# Patient Record
Sex: Male | Born: 1945 | Race: White | Hispanic: No | Marital: Married | Smoking: Never smoker
Health system: Southern US, Community
[De-identification: ages and names within clinical notes are randomized; demographics above are authoritative.]

## PROBLEM LIST (undated history)

## (undated) DIAGNOSIS — N289 Disorder of kidney and ureter, unspecified: Secondary | ICD-10-CM

## (undated) HISTORY — PX: JOINT REPLACEMENT: SHX530

---

## 2015-07-10 ENCOUNTER — Emergency Department (HOSPITAL_COMMUNITY): Payer: Medicare HMO

## 2015-07-10 ENCOUNTER — Encounter (HOSPITAL_COMMUNITY): Payer: Self-pay

## 2015-07-10 ENCOUNTER — Emergency Department (HOSPITAL_COMMUNITY)
Admission: EM | Admit: 2015-07-10 | Discharge: 2015-07-10 | Disposition: A | Discharge status: Home / Self Care | Payer: Medicare HMO | Attending: Emergency Medicine | Admitting: Emergency Medicine

## 2015-07-10 DIAGNOSIS — R109 Unspecified abdominal pain: Secondary | ICD-10-CM

## 2015-07-10 DIAGNOSIS — Z79899 Other long term (current) drug therapy: Secondary | ICD-10-CM | POA: Diagnosis not present

## 2015-07-10 DIAGNOSIS — N200 Calculus of kidney: Secondary | ICD-10-CM | POA: Insufficient documentation

## 2015-07-10 HISTORY — DX: Disorder of kidney and ureter, unspecified: N28.9

## 2015-07-10 LAB — URINE MICROSCOPIC-ADD ON

## 2015-07-10 LAB — URINALYSIS, ROUTINE W REFLEX MICROSCOPIC
BILIRUBIN URINE: NEGATIVE
Glucose, UA: NEGATIVE mg/dL
Ketones, ur: NEGATIVE mg/dL
NITRITE: NEGATIVE
PH: 5 (ref 5.0–8.0)
Protein, ur: 30 mg/dL — AB

## 2015-07-10 LAB — CBC WITH DIFFERENTIAL/PLATELET
Basophils Absolute: 0.1 10*3/uL (ref 0.0–0.1)
Basophils Relative: 1 %
Eosinophils Absolute: 0.1 10*3/uL (ref 0.0–0.7)
Eosinophils Relative: 1 %
HEMATOCRIT: 42.7 % (ref 39.0–52.0)
HEMOGLOBIN: 14.3 g/dL (ref 13.0–17.0)
LYMPHS ABS: 2.6 10*3/uL (ref 0.7–4.0)
Lymphocytes Relative: 37 %
MCH: 29.1 pg (ref 26.0–34.0)
MCHC: 33.5 g/dL (ref 30.0–36.0)
MCV: 86.8 fL (ref 78.0–100.0)
MONOS PCT: 9 %
Monocytes Absolute: 0.6 10*3/uL (ref 0.1–1.0)
NEUTROS ABS: 3.7 10*3/uL (ref 1.7–7.7)
NEUTROS PCT: 52 %
Platelets: 227 10*3/uL (ref 150–400)
RBC: 4.92 MIL/uL (ref 4.22–5.81)
RDW: 13.7 % (ref 11.5–15.5)
WBC: 7 10*3/uL (ref 4.0–10.5)

## 2015-07-10 LAB — BASIC METABOLIC PANEL
Anion gap: 8 (ref 5–15)
BUN: 13 mg/dL (ref 6–20)
CHLORIDE: 106 mmol/L (ref 101–111)
CO2: 23 mmol/L (ref 22–32)
CREATININE: 1.05 mg/dL (ref 0.61–1.24)
Calcium: 9.2 mg/dL (ref 8.9–10.3)
GFR calc non Af Amer: >60 mL/min (ref >60)
Glucose, Bld: 181 mg/dL — ABNORMAL HIGH (ref 65–99)
POTASSIUM: 3.6 mmol/L (ref 3.5–5.1)
Sodium: 137 mmol/L (ref 135–145)

## 2015-07-10 MED ORDER — OXYCODONE-ACETAMINOPHEN 5-325 MG PO TABS: 1.0000 | ORAL_TABLET | ORAL | Status: AC | PRN

## 2015-07-10 MED ORDER — ONDANSETRON HCL 4 MG/2ML IJ SOLN
4.0000 mg | Freq: Once | INTRAMUSCULAR | Status: AC
  Administered 2015-07-10: 4 mg via INTRAVENOUS
  Filled 2015-07-10: qty 2

## 2015-07-10 MED ORDER — KETOROLAC TROMETHAMINE 30 MG/ML IJ SOLN
30.0000 mg | Freq: Once | INTRAMUSCULAR | Status: AC
  Administered 2015-07-10: 30 mg via INTRAVENOUS
  Filled 2015-07-10: qty 1

## 2015-07-10 MED ORDER — MORPHINE SULFATE (PF) 4 MG/ML IV SOLN
4.0000 mg | Freq: Once | INTRAVENOUS | Status: AC
  Administered 2015-07-10: 4 mg via INTRAVENOUS
  Filled 2015-07-10: qty 1

## 2015-07-10 MED ORDER — SODIUM CHLORIDE 0.9 % IV BOLUS (SEPSIS)
1000.0000 mL | Freq: Once | INTRAVENOUS | Status: AC
  Administered 2015-07-10: 1000 mL via INTRAVENOUS

## 2015-07-10 MED ORDER — FENTANYL CITRATE (PF) 100 MCG/2ML IJ SOLN
50.0000 ug | INTRAMUSCULAR | Status: AC | PRN
  Administered 2015-07-10 (×2): 50 ug via INTRAVENOUS
  Filled 2015-07-10 (×2): qty 2

## 2015-07-10 MED ORDER — ONDANSETRON HCL 8 MG PO TABS: 8.0000 mg | ORAL_TABLET | ORAL | Status: AC | PRN

## 2015-07-10 NOTE — ED Notes (Signed)
Pt reports pain in left flank and llq that started around 0900 today.  Reports nausea.  Pt says has history of kidney stones.

## 2015-07-10 NOTE — ED Provider Notes (Signed)
CSN: [id number]     Arrival date & time 07/10/15  1322 History   First MD Initiated Contact with Patient 07/10/15 1324     Chief Complaint  Patient presents with  . Flank Pain     (Consider location/radiation/quality/duration/timing/severity/associated sxs/prior Treatment) HPI.Marland KitchenMarland KitchenMarland KitchenLeft flank pain since 10 AM today. Previous history of kidney stones. No dysuria, hematuria, fever, sweats, chills. Severity of pain is moderate. Nothing makes symptoms better or worse.  Past Medical History  Diagnosis Date  . Renal disorder     kidney stones   Past Surgical History  Procedure Laterality Date  . Joint replacement     No family history on file. Social History  Substance Use Topics  . Smoking status: Never Smoker   . Smokeless tobacco: None  . Alcohol Use: No    Review of Systems  All other systems reviewed and are negative.     Allergies  Review of patient's allergies indicates no known allergies.  Home Medications   Prior to Admission medications   Medication Sig Start Date End Date Taking? Authorizing Provider  loratadine (CLARITIN) 10 MG tablet Take 10 mg by mouth daily.   Yes Historical Provider, MD  pyridOXINE (VITAMIN B-6) 100 MG tablet Take 100 mg by mouth daily.   Yes Historical Provider, MD  tamsulosin (FLOMAX) 0.4 MG CAPS capsule Take 0.4 mg by mouth daily. 06/04/15  Yes Historical Provider, MD  traMADol (ULTRAM) 50 MG tablet Take 50 mg by mouth 3 (three) times daily as needed for moderate pain.  04/27/15  Yes Historical Provider, MD  ondansetron (ZOFRAN) 8 MG tablet Take 1 tablet (8 mg total) by mouth every 4 (four) hours as needed. 07/10/15   Donnetta Hutching, MD  oxyCODONE-acetaminophen (PERCOCET) 5-325 MG tablet Take 1-2 tablets by mouth every 4 (four) hours as needed. 07/10/15   Donnetta Hutching, MD   BP 143/88 mmHg  Pulse 75  Temp(Src) 97.5 F (36.4 C) (Oral)  Resp 19  Ht  (1.702 m)  Wt 200 lb (90.719 kg)  BMI 31.32 kg/m2  SpO2 98% Physical Exam  Constitutional:  He is oriented to person, place, and time. He appears well-developed and well-nourished.  HENT:  Head: Normocephalic and atraumatic.  Eyes: Conjunctivae and EOM are normal. Pupils are equal, round, and reactive to light.  Neck: Normal range of motion. Neck supple.  Cardiovascular: Normal rate and regular rhythm.   Pulmonary/Chest: Effort normal and breath sounds normal.  Abdominal: Soft. Bowel sounds are normal.  Genitourinary:  Tender right flank  Musculoskeletal: Normal range of motion.  Neurological: He is alert and oriented to person, place, and time.  Skin: Skin is warm and dry.  Psychiatric: He has a normal mood and affect. His behavior is normal.  Nursing note and vitals reviewed.   ED Course  Procedures (including critical care time) Labs Review Labs Reviewed  BASIC METABOLIC PANEL - Abnormal; Notable for the following:    Glucose, Bld 181 (*)    All other components within normal limits  URINALYSIS, ROUTINE W REFLEX MICROSCOPIC (NOT AT [facility]) - Abnormal; Notable for the following:    APPearance CLOUDY (*)    Specific Gravity, Urine >1.030 (*)    Hgb urine dipstick LARGE (*)    Protein, ur 30 (*)    Leukocytes, UA TRACE (*)    All other components within normal limits  URINE MICROSCOPIC-ADD ON - Abnormal; Notable for the following:    Squamous Epithelial / LPF 0-5 (*)    Bacteria, UA MANY (*)  Crystals CA OXALATE CRYSTALS (*)    All other components within normal limits  CBC WITH DIFFERENTIAL/PLATELET    Imaging Review No results found. I have personally reviewed and evaluated these images and lab results as part of my medical decision-making.   EKG Interpretation None      MDM   Final diagnoses:  Left flank pain  Kidney stone on left side    Renal CT reveals a 2-3 mm left UVJ stone with low-grade obstruction. Pain management. Referral to urology. Discharge medications Percocet and Zofran 8 mg. Patient is already taking Flomax.   Donnetta HutchingBrian Nayzeth Altman,  MD 07/12/15 2000

## 2015-07-10 NOTE — Discharge Instructions (Signed)
You have a small kidney stone near your bladder on the left side. Medicine for pain and nausea. Follow-up with urologist if not improving. Phone number given.

## 2016-12-30 IMAGING — CT CT RENAL STONE PROTOCOL
2 of 4 series · 16 of 46 positions shown, 18 images · non-contrast
Comparison: None.

CLINICAL DATA: Left flank pain since this morning. Hematuria.
History of kidney stones.

EXAM:
CT ABDOMEN AND PELVIS WITHOUT CONTRAST
TECHNIQUE: Multidetector CT imaging of the abdomen and pelvis was performed
following the standard protocol without IV contrast.

[Series 2: routine abd pel with · axial · 0.89mm/px · z∈[-509,-9]mm · 13 of 111 slices shown, 15 images]
[im 6/111  soft-tissue]
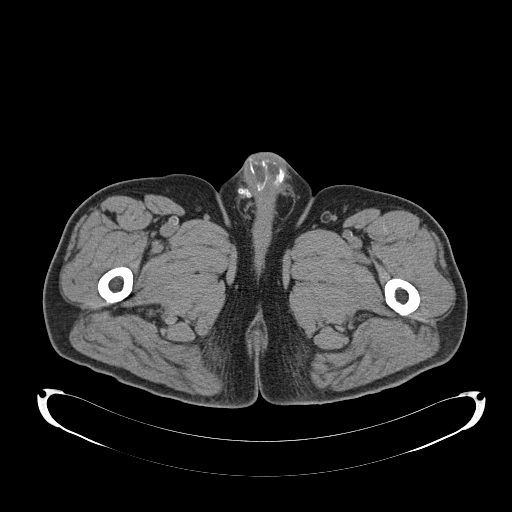
[im 6/111  bone]
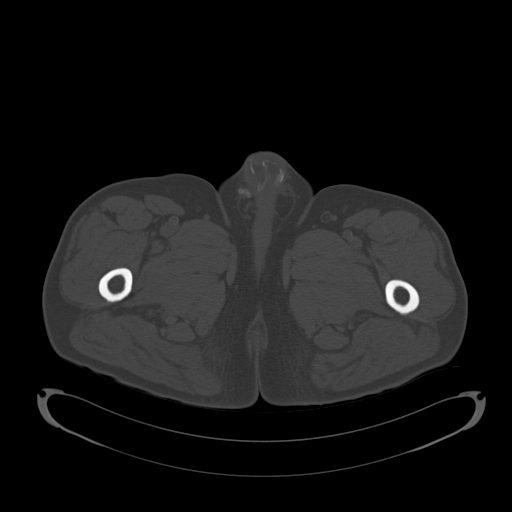
[im 16/111  soft-tissue]
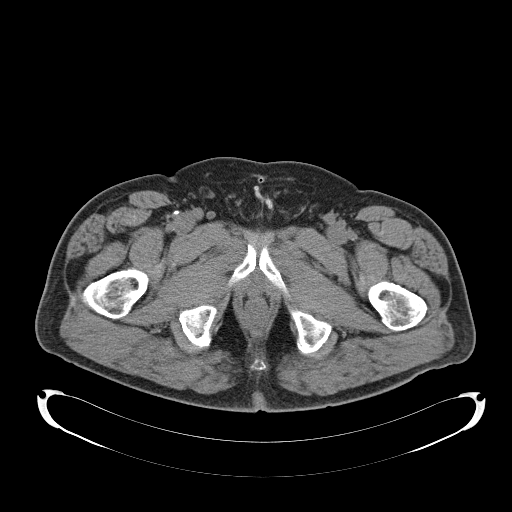
[im 26/111  soft-tissue]
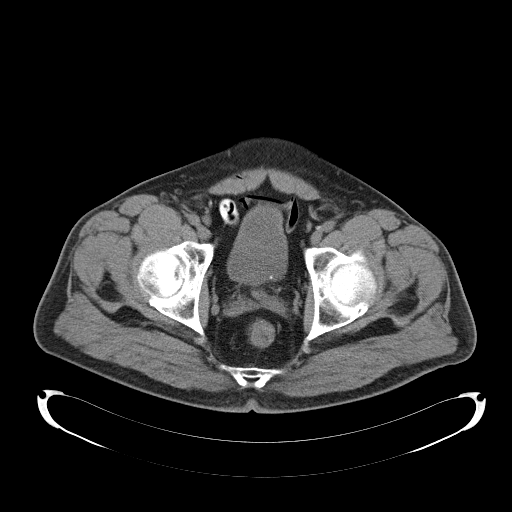
[im 31/111  soft-tissue]
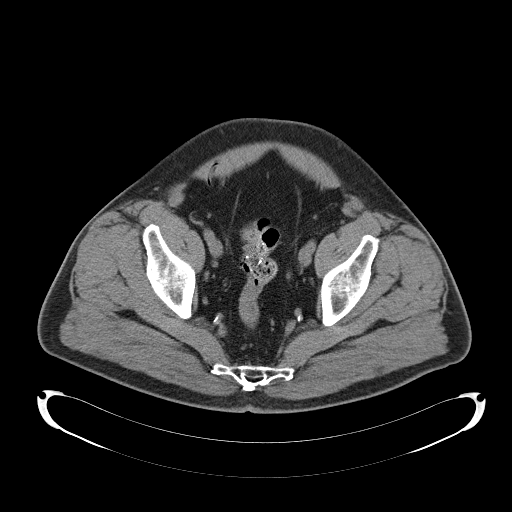
[im 41/111  soft-tissue]
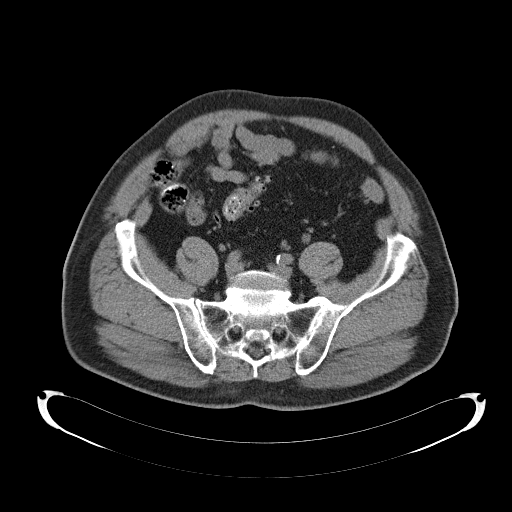
[im 46/111  soft-tissue]
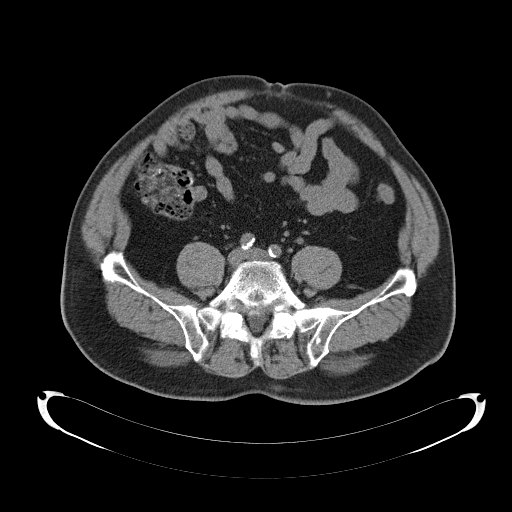
[im 56/111  soft-tissue]
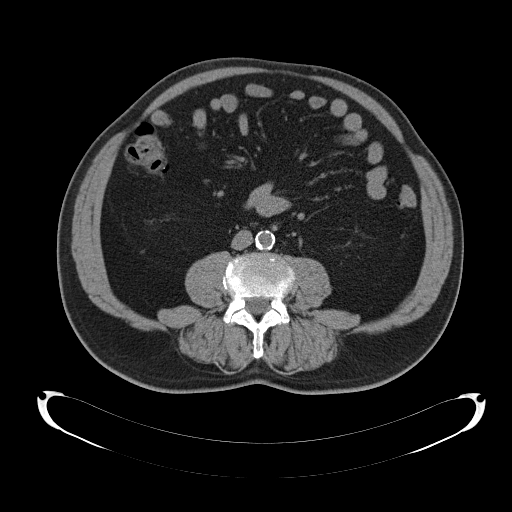
[im 66/111  soft-tissue]
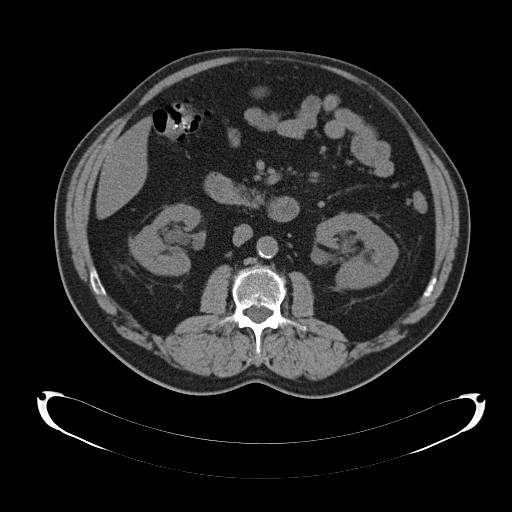
[im 71/111  soft-tissue]
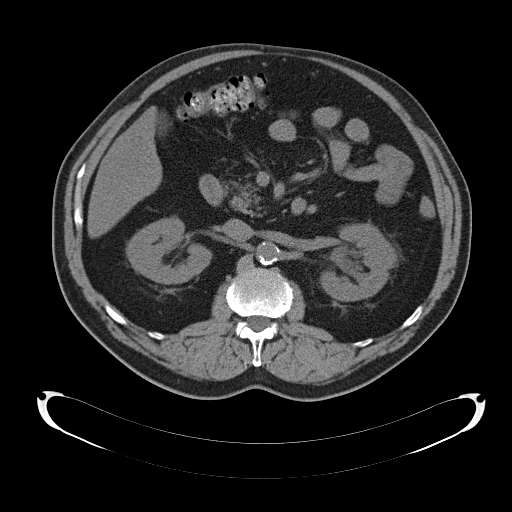
[im 71/111  bone]
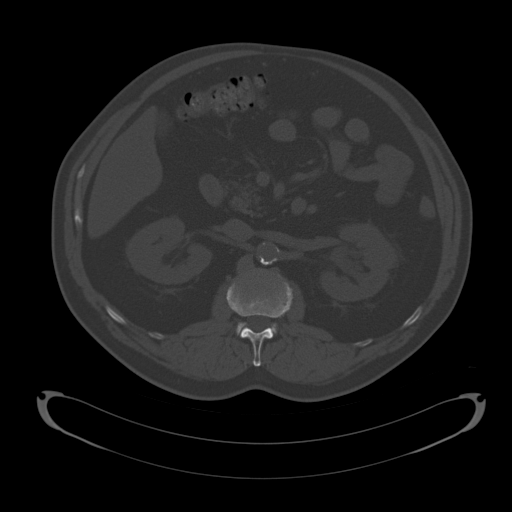
[im 81/111  soft-tissue]
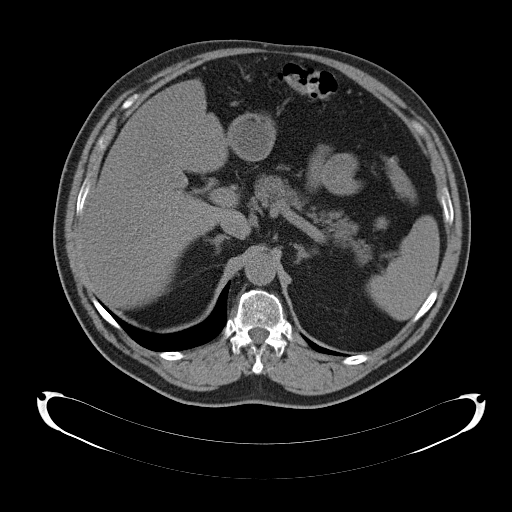
[im 86/111  soft-tissue]
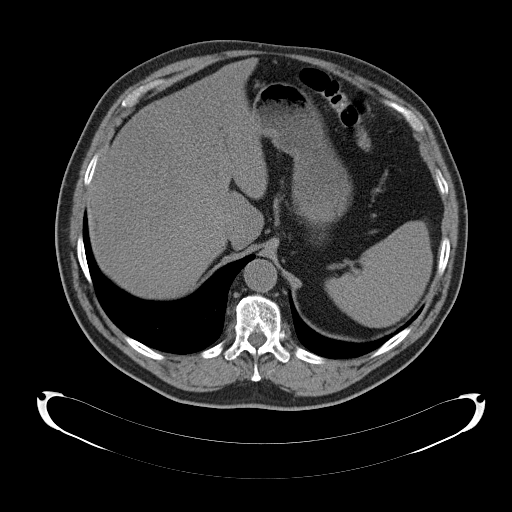
[im 96/111  soft-tissue]
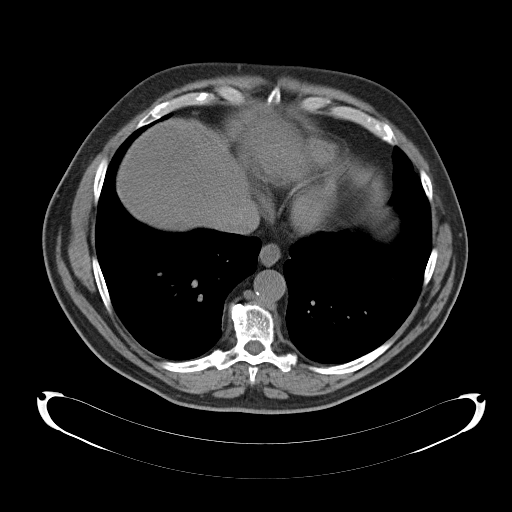
[im 106/111  soft-tissue]
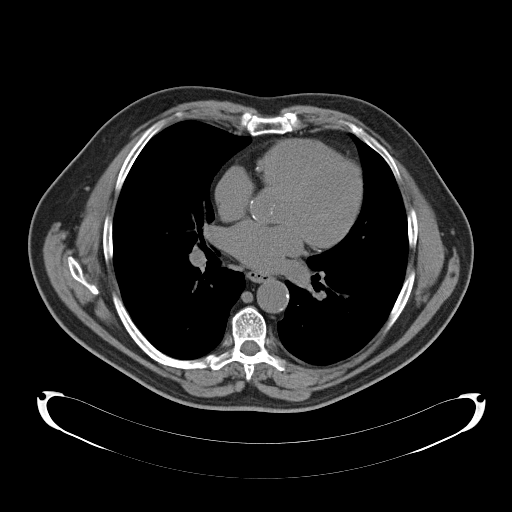

[Series 3: coronal · coronal · 0.81mm/px · 3 of 178 slices shown]
[im 60/178  soft-tissue]
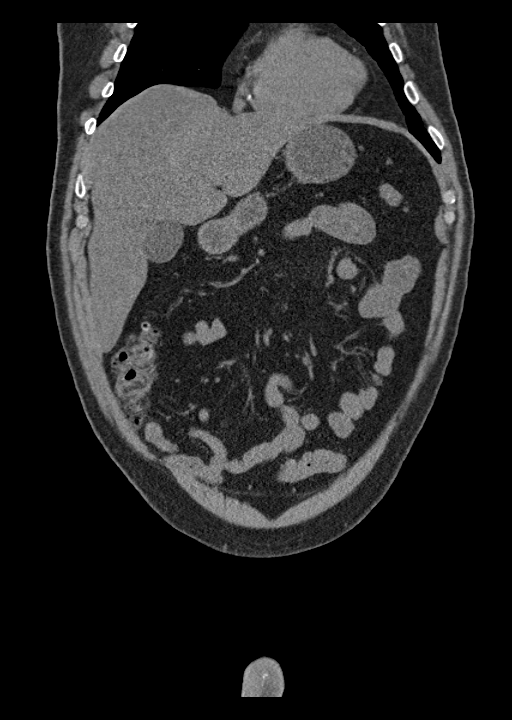
[im 79/178  soft-tissue]
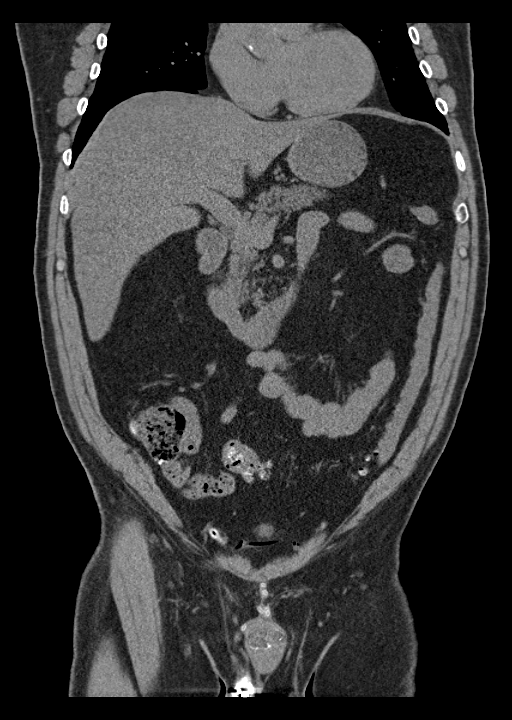
[im 99/178  soft-tissue]
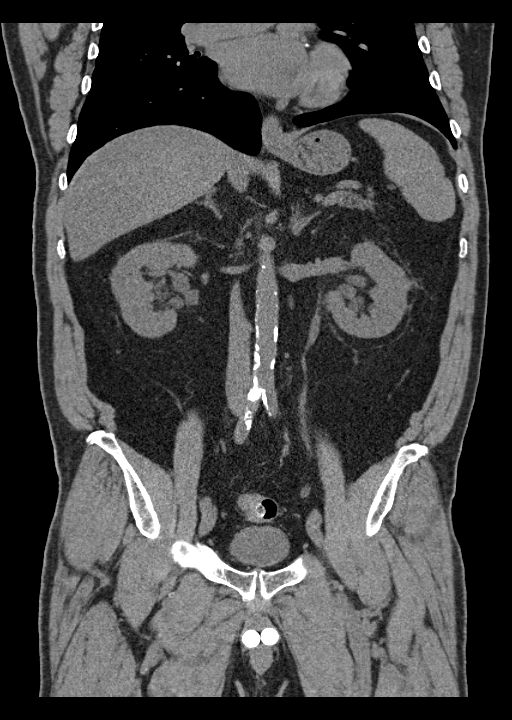

[16 of 46 positions shown; findings below may reference images not displayed]

FINDINGS: Lung bases are normal. Calcified plaque is present over the left
main and 3 vessel coronary arteries.

Abdominal images demonstrate a normal liver, spleen, pancreas,
gallbladder and adrenal glands.

Kidneys are normal in size with a few small bilateral stones
present. There are a few small bilateral parapelvic renal cysts.
There is mild left-sided hydronephrosis. There is a 2-3 mm stone at
the left UVJ causing this low-grade obstruction. Right ureter is
normal.

Mild calcified plaque over the abdominal aorta and iliac arteries.

Stomach is within normal. Small bowel is within normal. Appendix is
normal. There is moderate diverticulosis throughout the colon.

Pelvic images demonstrate p.m. prosthesis apparatus. The bladder,
prostate and rectum are normal.

There are mild degenerative changes of the spine and hips.
IMPRESSION: Mild bilateral nephrolithiasis. 2-3 mm stone at the left UVJ causing
low-grade obstruction.

Small bilateral parapelvic renal cysts.

Diverticulosis of the colon.

Left main and 3 vessel atherosclerotic coronary artery disease.
